# Patient Record
Sex: Female | Born: 1990 | Race: Black or African American | Hispanic: No | Marital: Single | Smoking: Current every day smoker
Health system: Southern US, Community
[De-identification: ages and names within clinical notes are randomized; demographics above are authoritative.]

## PROBLEM LIST (undated history)

## (undated) DIAGNOSIS — R030 Elevated blood-pressure reading, without diagnosis of hypertension: Secondary | ICD-10-CM

## (undated) DIAGNOSIS — R7303 Prediabetes: Secondary | ICD-10-CM

---

## 2015-03-05 ENCOUNTER — Emergency Department (HOSPITAL_BASED_OUTPATIENT_CLINIC_OR_DEPARTMENT_OTHER): Payer: BLUE CROSS/BLUE SHIELD

## 2015-03-05 ENCOUNTER — Encounter (HOSPITAL_BASED_OUTPATIENT_CLINIC_OR_DEPARTMENT_OTHER): Payer: Self-pay | Admitting: Emergency Medicine

## 2015-03-05 ENCOUNTER — Emergency Department (HOSPITAL_BASED_OUTPATIENT_CLINIC_OR_DEPARTMENT_OTHER)
Admission: EM | Admit: 2015-03-05 | Discharge: 2015-03-06 | Disposition: A | Discharge status: Home / Self Care | Payer: BLUE CROSS/BLUE SHIELD | Attending: Physician Assistant | Admitting: Physician Assistant

## 2015-03-05 DIAGNOSIS — S61216A Laceration without foreign body of right little finger without damage to nail, initial encounter: Secondary | ICD-10-CM | POA: Insufficient documentation

## 2015-03-05 DIAGNOSIS — Z23 Encounter for immunization: Secondary | ICD-10-CM | POA: Diagnosis not present

## 2015-03-05 DIAGNOSIS — Y9321 Activity, ice skating: Secondary | ICD-10-CM | POA: Insufficient documentation

## 2015-03-05 DIAGNOSIS — Y998 Other external cause status: Secondary | ICD-10-CM | POA: Insufficient documentation

## 2015-03-05 DIAGNOSIS — Y9289 Other specified places as the place of occurrence of the external cause: Secondary | ICD-10-CM | POA: Diagnosis not present

## 2015-03-05 DIAGNOSIS — F17200 Nicotine dependence, unspecified, uncomplicated: Secondary | ICD-10-CM | POA: Insufficient documentation

## 2015-03-05 DIAGNOSIS — S6991XA Unspecified injury of right wrist, hand and finger(s), initial encounter: Secondary | ICD-10-CM | POA: Diagnosis present

## 2015-03-05 DIAGNOSIS — IMO0002 Reserved for concepts with insufficient information to code with codable children: Secondary | ICD-10-CM

## 2015-03-05 MED ORDER — HYDROCODONE-ACETAMINOPHEN 5-325 MG PO TABS: 1.0000 | ORAL_TABLET | Freq: Once | ORAL | Status: DC

## 2015-03-05 MED ORDER — LIDOCAINE HCL (PF) 1 % IJ SOLN
5.0000 mL | Freq: Once | INTRAMUSCULAR | Status: AC
  Administered 2015-03-05: 5 mL
  Filled 2015-03-05: qty 5

## 2015-03-05 MED ORDER — TETANUS-DIPHTH-ACELL PERTUSSIS 5-2.5-18.5 LF-MCG/0.5 IM SUSP
0.5000 mL | Freq: Once | INTRAMUSCULAR | Status: AC
  Administered 2015-03-05: 0.5 mL via INTRAMUSCULAR
  Filled 2015-03-05: qty 0.5

## 2015-03-05 NOTE — ED Notes (Signed)
Pt in c/o laceration to R hand. Occurred while ice skating, she fell and another person stepped on her hand with their skate. Bleeding controlled.

## 2015-03-05 NOTE — ED Provider Notes (Signed)
CSN: [id number]     Arrival date & time 03/05/15  2143 History   First MD Initiated Contact with Patient 03/05/15 2147     Chief Complaint  Patient presents with  . Extremity Laceration     (Consider location/radiation/quality/duration/timing/severity/associated sxs/prior Treatment) Patient is a 25 y.o. female presenting with skin laceration. The history is provided by the patient.  Laceration Location:  Hand Hand laceration location:  R finger Length (cm):  2 Depth:  Through underlying tissue Quality: straight   Bleeding: controlled with pressure   Time since incident: just prior to arrival to the ED. Laceration mechanism:  Metal edge (blade of ice skate) Pain details:    Quality:  Sharp   Severity:  Moderate   Timing:  Constant   Progression:  Worsening Foreign body present:  No foreign bodies Relieved by:  None tried Worsened by:  Movement Tetanus status:  Out of date  Jill Cole is a 25 y.o. female who presents to the ED with a laceration to the right little finger. She states that she fell and someone skated over her finger causing pain and laceration.   History reviewed. No pertinent past medical history. History reviewed. No pertinent past surgical history. History reviewed. No pertinent family history. Social History  Substance Use Topics  . Smoking status: Current Every Day Smoker  . Smokeless tobacco: None  . Alcohol Use: Yes   OB History    No data available     Review of Systems  Musculoskeletal: Positive for joint swelling.       Right little finger pain  Skin: Positive for wound.  all other systems negative    Allergies  Review of patient's allergies indicates no known allergies.  Home Medications   Prior to Admission medications   Medication Sig Start Date End Date Taking? Authorizing Provider  cephALEXin (KEFLEX) 500 MG capsule Take 1 capsule (500 mg total) by mouth 3 (three) times daily. 03/06/15   Jill Orlene Och, NP    HYDROcodone-acetaminophen (NORCO/VICODIN) 5-325 MG tablet Take 2 tablets by mouth every 4 (four) hours as needed. 03/06/15   Jill Orlene Och, NP   BP 136/86 mmHg  Pulse 85  Temp(Src) 98 F (36.7 C) (Oral)  Resp 20  SpO2 97% Physical Exam  Constitutional: She is oriented to person, place, and time. She appears well-developed and well-nourished. No distress.  HENT:  Head: Normocephalic and atraumatic.  Eyes: Conjunctivae and EOM are normal.  Neck: Neck supple.  Cardiovascular: Normal rate.   Pulmonary/Chest: Effort normal.  Abdominal: Soft. There is no tenderness.  Musculoskeletal: Normal range of motion.       Right hand: She exhibits tenderness, bony tenderness, laceration and swelling. She exhibits normal range of motion and normal capillary refill. Normal sensation noted. Normal strength noted. She exhibits no thumb/finger opposition.       Hands: Extensor tendon visualized and laceration noted.   Neurological: She is alert and oriented to person, place, and time. No cranial nerve deficit.  Skin: Skin is warm and dry.  Psychiatric: She has a normal mood and affect. Her behavior is normal.  Nursing note and vitals reviewed.   ED Course  Procedures  LACERATION REPAIR Performed by: NEESE,Jill Authorized by: NEESE,Jill Consent: Verbal consent obtained. Risks and benefits: risks, benefits and alternatives were discussed Consent given by: patient Patient identity confirmed: provided demographic data Prepped and Draped in normal sterile fashion  Wound explored:  There is a laceration of the extensor tendon  Laceration Location: dorsum  of right little finger  Laceration Length: 4cm  No Foreign Bodies seen or palpated  Anesthesia: local infiltration  Local anesthetic: lidocaine 1% without epinephrine  Anesthetic total: 3 ml  Irrigation method: syringe  Amount of cleaning: standard  Skin closure: 4-0 prolene  Number of sutures: 10  Technique: intrrupted  Patient  tolerance: Patient tolerated the procedure well with no immediate complications.  Dg Hand Complete Right  03/05/2015  CLINICAL DATA:  Laceration to fingers. EXAM: RIGHT HAND - COMPLETE 3+ VIEW COMPARISON:  None. FINDINGS: Normal alignment no fracture. Bandage overlying the fifth finger. No foreign body. IMPRESSION: Negative for fracture or foreign body. Electronically Signed   By: Marlan Palau M.D.   On: 03/05/2015 23:09     MDM  25 y.o. female with laceration of the right little finger stable for d/c with full flexion and extension of the little finger and good strength despite laceration of the tendon. Instructed patient to schedule appointment for follow up with the hand surgeon. Will start antibiotics and pain management. Discussed d/c instructions with the patient and her mother.   Final diagnoses:  Laceration of right little finger w/o foreign body w/o damage to nail, initial encounter        Janne Napoleon, NP 03/06/15 0012  Courteney Randall An, MD 03/06/15 2254

## 2015-03-06 DIAGNOSIS — S61216A Laceration without foreign body of right little finger without damage to nail, initial encounter: Secondary | ICD-10-CM | POA: Diagnosis not present

## 2015-03-06 MED ORDER — BACITRACIN ZINC 500 UNIT/GM EX OINT
TOPICAL_OINTMENT | Freq: Two times a day (BID) | CUTANEOUS | Status: DC
  Administered 2015-03-06: 15.5556 via TOPICAL
  Filled 2015-03-06: qty 28.35

## 2015-03-06 MED ORDER — HYDROCODONE-ACETAMINOPHEN 5-325 MG PO TABS: 2.0000 | ORAL_TABLET | ORAL | Status: DC | PRN

## 2015-03-06 MED ORDER — CEPHALEXIN 250 MG PO CAPS
500.0000 mg | ORAL_CAPSULE | Freq: Once | ORAL | Status: AC
  Administered 2015-03-06: 500 mg via ORAL
  Filled 2015-03-06: qty 2

## 2015-03-06 MED ORDER — CEPHALEXIN 500 MG PO CAPS: 500.0000 mg | ORAL_CAPSULE | Freq: Three times a day (TID) | ORAL | Status: DC

## 2015-03-06 NOTE — ED Notes (Signed)
Pt understood the procedure and all questions were answered. CMS intact before and after.

## 2015-03-06 NOTE — Discharge Instructions (Signed)
You will need to call the hand surgeon tomorrow for follow up. The sutures will need to stay in for 10 days unless the hand surgeon sees your before then.

## 2015-11-25 ENCOUNTER — Encounter (HOSPITAL_COMMUNITY): Payer: Self-pay

## 2015-11-25 ENCOUNTER — Emergency Department (HOSPITAL_COMMUNITY)
Admission: EM | Admit: 2015-11-25 | Discharge: 2015-11-26 | Disposition: A | Discharge status: Home / Self Care | Payer: BLUE CROSS/BLUE SHIELD | Attending: Emergency Medicine | Admitting: Emergency Medicine

## 2015-11-25 DIAGNOSIS — A5901 Trichomonal vulvovaginitis: Secondary | ICD-10-CM | POA: Insufficient documentation

## 2015-11-25 DIAGNOSIS — F17200 Nicotine dependence, unspecified, uncomplicated: Secondary | ICD-10-CM | POA: Insufficient documentation

## 2015-11-25 DIAGNOSIS — R102 Pelvic and perineal pain: Secondary | ICD-10-CM | POA: Diagnosis not present

## 2015-11-25 DIAGNOSIS — R1030 Lower abdominal pain, unspecified: Secondary | ICD-10-CM | POA: Diagnosis present

## 2015-11-25 LAB — URINALYSIS, ROUTINE W REFLEX MICROSCOPIC
Bilirubin Urine: NEGATIVE
Glucose, UA: NEGATIVE mg/dL
Hgb urine dipstick: NEGATIVE
Ketones, ur: NEGATIVE mg/dL
Leukocytes, UA: NEGATIVE
Nitrite: NEGATIVE
Protein, ur: NEGATIVE mg/dL
Specific Gravity, Urine: 1.019 (ref 1.005–1.030)
pH: 8 (ref 5.0–8.0)

## 2015-11-25 LAB — CBC
HCT: 41.5 % (ref 36.0–46.0)
Hemoglobin: 13.2 g/dL (ref 12.0–15.0)
MCH: 27 pg (ref 26.0–34.0)
MCHC: 31.8 g/dL (ref 30.0–36.0)
MCV: 84.9 fL (ref 78.0–100.0)
Platelets: 291 K/uL (ref 150–400)
RBC: 4.89 MIL/uL (ref 3.87–5.11)
RDW: 14.2 % (ref 11.5–15.5)
WBC: 12.9 K/uL — ABNORMAL HIGH (ref 4.0–10.5)

## 2015-11-25 LAB — COMPREHENSIVE METABOLIC PANEL
ALBUMIN: 3.8 g/dL (ref 3.5–5.0)
ALK PHOS: 58 U/L (ref 38–126)
ALT: 17 U/L (ref 14–54)
ANION GAP: 8 (ref 5–15)
AST: 26 U/L (ref 15–41)
BILIRUBIN TOTAL: 0.3 mg/dL (ref 0.3–1.2)
BUN: 5 mg/dL — ABNORMAL LOW (ref 6–20)
CALCIUM: 9.8 mg/dL (ref 8.9–10.3)
CO2: 24 mmol/L (ref 22–32)
Chloride: 107 mmol/L (ref 101–111)
Creatinine, Ser: 0.79 mg/dL (ref 0.44–1.00)
GFR calc Af Amer: >60 mL/min (ref >60)
GLUCOSE: 121 mg/dL — AB (ref 65–99)
Potassium: 3.6 mmol/L (ref 3.5–5.1)
Sodium: 139 mmol/L (ref 135–145)
TOTAL PROTEIN: 8 g/dL (ref 6.5–8.1)

## 2015-11-25 LAB — I-STAT BETA HCG BLOOD, ED (MC, WL, AP ONLY)

## 2015-11-25 LAB — URINE MICROSCOPIC-ADD ON: RBC / HPF: NONE SEEN RBC/hpf (ref 0–5)

## 2015-11-25 LAB — LIPASE, BLOOD: Lipase: 26 U/L (ref 11–51)

## 2015-11-25 NOTE — ED Triage Notes (Signed)
Pt reports lower abdominal pain/pelvic pain, onset today at 6pm. Denies vaginal bleeding or discharge. Emesis x1 of phlegm today. LBM today "but it wasn't a full BM." Denies exposure to STDs. Denies trouble urinating.

## 2015-11-25 NOTE — ED Provider Notes (Signed)
[facility]-EMERGENCY DEPT Provider Note   CSN: [id number] Arrival date & time: 11/25/15  2037  By signing my name below, I, Emmanuella Mensah, attest that this documentation has been prepared under the direction and in the presence of Kerrie BuffaloHope Erasto Sleight, NP. Electronically Signed: Angelene GiovanniEmmanuella Mensah, ED Scribe. 11/25/15. 11:22 PM.   History   Chief Complaint Chief Complaint  Patient presents with  . Abdominal Pain    HPI Comments: Jill Cole is a 25 y.o. female who presents to the Emergency Department complaining of gradually worsening moderate suprapubic pain she describes as squeezing and cramping onset today. She reports associated episode of vomiting up phlegm today. No alleviating factors noted. Pt has not tried any medications PTA. She has NKDA. She reports a hx of Chlamydia last year. She states that she is currently sexually active with on partner and does not always use protection. Pt is not currently on birth control. She explains that she was treated with Macrobid earlier this week for a UTI. She denies any fever, chills, nausea, vaginal bleeding, vaginal discharge, dysuria, hematuria, or any other symptoms.   The history is provided by the patient. No language interpreter was used.    History reviewed. No pertinent past medical history.  There are no active problems to display for this patient.   History reviewed. No pertinent surgical history.  OB History    No data available       Home Medications    Prior to Admission medications   Medication Sig Start Date End Date Taking? Authorizing Provider  metroNIDAZOLE (FLAGYL) 500 MG tablet Take 1 tablet (500 mg total) by mouth 2 (two) times daily. 11/26/15   Devlyn Parish Orlene OchM Jamice Carreno, NP    Family History No family history on file.  Social History Social History  Substance Use Topics  . Smoking status: Current Every Day Smoker  . Smokeless tobacco: Not on file  . Alcohol use Yes     Allergies   Review of patient's allergies  indicates no known allergies.   Review of Systems Review of Systems  Constitutional: Negative for chills and fever.  Gastrointestinal: Positive for abdominal pain and vomiting. Negative for nausea.  Genitourinary: Negative for dysuria, hematuria, vaginal bleeding and vaginal discharge.  All other systems reviewed and are negative.    Physical Exam Updated Vital Signs BP 138/90 (BP Location: Left Arm)   Pulse 87   Temp 98.1 F (36.7 C) (Oral)   Resp 18   Ht 5\' 3"  (1.6 m) Comment: Simultaneous filing. User may not have seen previous data.  Wt 117.5 kg Comment: Simultaneous filing. User may not have seen previous data.  LMP 10/12/2015   SpO2 100%   BMI 45.88 kg/m   Physical Exam  Constitutional: She is oriented to person, place, and time. She appears well-developed and well-nourished. No distress.  HENT:  Head: Normocephalic and atraumatic.  Eyes: Conjunctivae and EOM are normal.  Neck: Neck supple. No tracheal deviation present.  Cardiovascular: Normal rate.   Pulmonary/Chest: Effort normal. No respiratory distress.  Abdominal: Soft. Bowel sounds are normal. There is no tenderness.  Unable to reproduce the cramping pain the patient had earlier.   Genitourinary:  Genitourinary Comments: External genitalia without lesions, frothy d/c vaginal vault, no CMT, no adnexal tenderness or mass palpated, uterus without palpable enlargement.   Musculoskeletal: Normal range of motion.  Neurological: She is alert and oriented to person, place, and time.  Skin: Skin is warm and dry.  Psychiatric: She has a normal mood and affect.  Her behavior is normal.  Nursing note and vitals reviewed.    ED Treatments / Results  DIAGNOSTIC STUDIES: Oxygen Saturation is 100% on RA, normal by my interpretation.    COORDINATION OF CARE: 11:18 PM- Pt advised of plan for treatment and pt agrees. Pt will receive pelvic examination for further evaluation.    Labs (all labs ordered are listed, but  only abnormal results are displayed) Labs Reviewed  WET PREP, GENITAL - Abnormal; Notable for the following:       Result Value   Trich, Wet Prep PRESENT (*)    Clue Cells Wet Prep HPF POC PRESENT (*)    WBC, Wet Prep HPF POC FEW (*)    All other components within normal limits  COMPREHENSIVE METABOLIC PANEL - Abnormal; Notable for the following:    Glucose, Bld 121 (*)    BUN 5 (*)    All other components within normal limits  CBC - Abnormal; Notable for the following:    WBC 12.9 (*)    All other components within normal limits  URINALYSIS, ROUTINE W REFLEX MICROSCOPIC (NOT AT [facility]) - Abnormal; Notable for the following:    APPearance TURBID (*)    All other components within normal limits  URINE MICROSCOPIC-ADD ON - Abnormal; Notable for the following:    Squamous Epithelial / LPF 0-5 (*)    Bacteria, UA FEW (*)    All other components within normal limits  LIPASE, BLOOD  RPR  HIV ANTIBODY (ROUTINE TESTING)  I-STAT BETA HCG BLOOD, ED ([facility], [facility], [facility] ONLY)  GC/CHLAMYDIA PROBE AMP ([place]) NOT AT [facility]    Radiology No results found.  Procedures Procedures (including critical care time)  Medications Ordered in ED Medications - No data to display   Initial Impression / Assessment and Plan / ED Course  Kerrie Buffalo, NP has reviewed the triage vital signs and the nursing notes.  Pertinent lab results that were available during my care of the patient were reviewed by me and considered in my medical decision making (see chart for details).  Clinical Course    Patient is nontoxic, nonseptic appearing, in no apparent distress.  Patient's pain and other symptoms adequately managed in emergency department.  Fluid bolus given. Labs and VS reviewed.  Patient does not meet the SIRS or Sepsis criteria.  On repeat exam patient does not have a surgical abdomin and there are no peritoneal signs.  No indication of appendicitis, bowel obstruction, bowel perforation, cholecystitis,  diverticulitis, PID or ectopic pregnancy.  Patient discharged home with  Treatment for trichomonas and need for partner treatment and given strict instructions for follow-up with [facility].  I have also discussed reasons to return immediately to the ER.  Patient expresses understanding and agrees with plan.  Final Clinical Impressions(s) / ED Diagnoses   Final diagnoses:  Pelvic pain in female  Trichomonas vaginitis    New Prescriptions New Prescriptions   METRONIDAZOLE (FLAGYL) 500 MG TABLET    Take 1 tablet (500 mg total) by mouth 2 (two) times daily.   I personally performed the services described in this documentation, which was scribed in my presence. The recorded information has been reviewed and is accurate.    [address] [place], NP 11/26/15 1610    Gilda Crease, MD 11/27/15 [phone]

## 2015-11-26 LAB — WET PREP, GENITAL
SPERM: NONE SEEN
YEAST WET PREP: NONE SEEN

## 2015-11-26 LAB — RPR: RPR Ser Ql: NONREACTIVE

## 2015-11-26 MED ORDER — METRONIDAZOLE 500 MG PO TABS: 500.0000 mg | ORAL_TABLET | Freq: Two times a day (BID) | ORAL | 0 refills | Status: AC

## 2015-11-27 LAB — HIV ANTIBODY (ROUTINE TESTING W REFLEX): HIV Screen 4th Generation wRfx: NONREACTIVE

## 2015-11-28 LAB — GC/CHLAMYDIA PROBE AMP (~~LOC~~) NOT AT ARMC
CHLAMYDIA, DNA PROBE: NEGATIVE
NEISSERIA GONORRHEA: NEGATIVE

## 2017-04-30 ENCOUNTER — Other Ambulatory Visit: Payer: Self-pay

## 2017-04-30 ENCOUNTER — Encounter (HOSPITAL_BASED_OUTPATIENT_CLINIC_OR_DEPARTMENT_OTHER): Payer: Self-pay

## 2017-04-30 ENCOUNTER — Emergency Department (HOSPITAL_BASED_OUTPATIENT_CLINIC_OR_DEPARTMENT_OTHER)
Admission: EM | Admit: 2017-04-30 | Discharge: 2017-04-30 | Disposition: A | Discharge status: Home / Self Care | Payer: BLUE CROSS/BLUE SHIELD | Attending: Emergency Medicine | Admitting: Emergency Medicine

## 2017-04-30 DIAGNOSIS — L299 Pruritus, unspecified: Secondary | ICD-10-CM | POA: Diagnosis not present

## 2017-04-30 DIAGNOSIS — F17210 Nicotine dependence, cigarettes, uncomplicated: Secondary | ICD-10-CM | POA: Insufficient documentation

## 2017-04-30 DIAGNOSIS — R21 Rash and other nonspecific skin eruption: Secondary | ICD-10-CM | POA: Insufficient documentation

## 2017-04-30 HISTORY — DX: Elevated blood-pressure reading, without diagnosis of hypertension: R03.0

## 2017-04-30 HISTORY — DX: Prediabetes: R73.03

## 2017-04-30 MED ORDER — PERMETHRIN 5 % EX CREA: TOPICAL_CREAM | CUTANEOUS | 0 refills | Status: AC

## 2017-04-30 MED ORDER — HYDROXYZINE HCL 25 MG PO TABS: 25.0000 mg | ORAL_TABLET | Freq: Four times a day (QID) | ORAL | 0 refills | Status: AC | PRN

## 2017-04-30 NOTE — ED Triage Notes (Signed)
C/o scattered rash x 2 weeks-NAD-steady gait 

## 2017-04-30 NOTE — ED Provider Notes (Signed)
[facility] EMERGENCY DEPARTMENT Provider Note   CSN: [id number] Arrival date & time: 04/30/17  2200     History   Chief Complaint Chief Complaint  Patient presents with  . Rash    HPI Jill Cole is a 27 y.o. female.  HPI   2 weeks of rash Present over arms, breasts, legs, one on hand in web space, crease of arm, and groin. Itching Hx of hives over the summer but this is different Friend has eczema over legs, no known contacts No new medications, no new exposures, no pets, detergents, foods Feels like popping up in spots and lines on arms Tried hydroxizine which helped temporarily Tried aveeno antiitch No dyspnea, nausea, vomiting or fevers   Past Medical History:  Diagnosis Date  . Prediabetes   . Prehypertension     There are no active problems to display for this patient.   History reviewed. No pertinent surgical history.   OB History   None      Home Medications    Prior to Admission medications   Medication Sig Start Date End Date Taking? Authorizing Provider  hydrOXYzine (ATARAX/VISTARIL) 25 MG tablet Take 1-2 tablets (25-50 mg total) by mouth every 6 (six) hours as needed for itching. 04/30/17   Alvira MondaySchlossman, Christropher Gintz, MD  metroNIDAZOLE (FLAGYL) 500 MG tablet Take 1 tablet (500 mg total) by mouth 2 (two) times daily. 11/26/15   Janne NapoleonNeese, Hope M, NP  permethrin (ELIMITE) 5 % cream Apply to affected area once from the neck down, leave on for 8-14 hours, then wash off. Repeat in 1 week. 04/30/17   Alvira MondaySchlossman, Wells Gerdeman, MD    Family History No family history on file.  Social History Social History   Tobacco Use  . Smoking status: Current Every Day Smoker    Types: Cigarettes  . Smokeless tobacco: Never Used  Substance Use Topics  . Alcohol use: Yes    Comment: occ  . Drug use: Never     Allergies   Patient has no known allergies.   Review of Systems Review of Systems  Constitutional: Negative for fever.  HENT: Negative for sore  throat.   Eyes: Negative for visual disturbance.  Respiratory: Negative for cough and shortness of breath.   Cardiovascular: Negative for chest pain.  Gastrointestinal: Negative for abdominal pain.  Genitourinary: Negative for difficulty urinating.  Musculoskeletal: Negative for back pain and neck pain.  Skin: Positive for rash.  Neurological: Negative for syncope and headaches.     Physical Exam Updated Vital Signs BP (!) 148/103 (BP Location: Right Arm)   Pulse 95   Temp 98.2 F (36.8 C) (Oral)   Resp 20   Ht 5\' 3"  (1.6 m)   Wt 127.7 kg (281 lb 9.6 oz)   LMP 04/12/2017   SpO2 99%   BMI 49.88 kg/m   Physical Exam  Constitutional: She is oriented to person, place, and time. She appears well-developed and well-nourished. No distress.  HENT:  Head: Normocephalic and atraumatic.  Eyes: Conjunctivae and EOM are normal.  Neck: Normal range of motion.  Cardiovascular: Normal rate, regular rhythm and intact distal pulses.  Pulmonary/Chest: Effort normal. No respiratory distress. She has no wheezes.  Musculoskeletal: She exhibits no edema or tenderness.  Neurological: She is alert and oriented to person, place, and time.  Skin: Skin is warm and dry. Rash (      tiny papules scattered, some over webspace right hand, over arm, leg, chest, back) noted. She is not diaphoretic. No  erythema.  Nursing note and vitals reviewed.    ED Treatments / Results  Labs (all labs ordered are listed, but only abnormal results are displayed) Labs Reviewed - No data to display  EKG None  Radiology No results found.  Procedures Procedures (including critical care time)  Medications Ordered in ED Medications - No data to display   Initial Impression / Assessment and Plan / ED Course  I have reviewed the triage vital signs and the nursing notes.  Pertinent labs & imaging results that were available during my care of the patient were reviewed by me and considered in my medical  decision making (see chart for details).     27 year old female with no significant medical history presents with concern of rash for 14 days. Rash does not have the appearance of SSS, TEN, erythroderma, RMSF or hives.   Pruritic rash allergic versus possible allergic to bites.  Some lesions and location concerning for scabies. Discussed empiric scabies treatment and care, provided rx for permethrin. Given rx for hydroxyzine. Patient discharged in stable condition with understanding of reasons to return.   Final Clinical Impressions(s) / ED Diagnoses   Final diagnoses:  Rash    ED Discharge Orders        Ordered    permethrin (ELIMITE) 5 % cream     04/30/17 2314    hydrOXYzine (ATARAX/VISTARIL) 25 MG tablet  Every 6 hours PRN     04/30/17 2314       Alvira Monday, MD 05/01/17 0340

## 2017-05-05 IMAGING — CR DG HAND COMPLETE 3+V*R*
3 series · 3 of 3 positions shown · non-contrast
Comparison: None.

CLINICAL DATA: Laceration to fingers.

EXAM:
RIGHT HAND - COMPLETE 3+ VIEW

[x hand pa right]
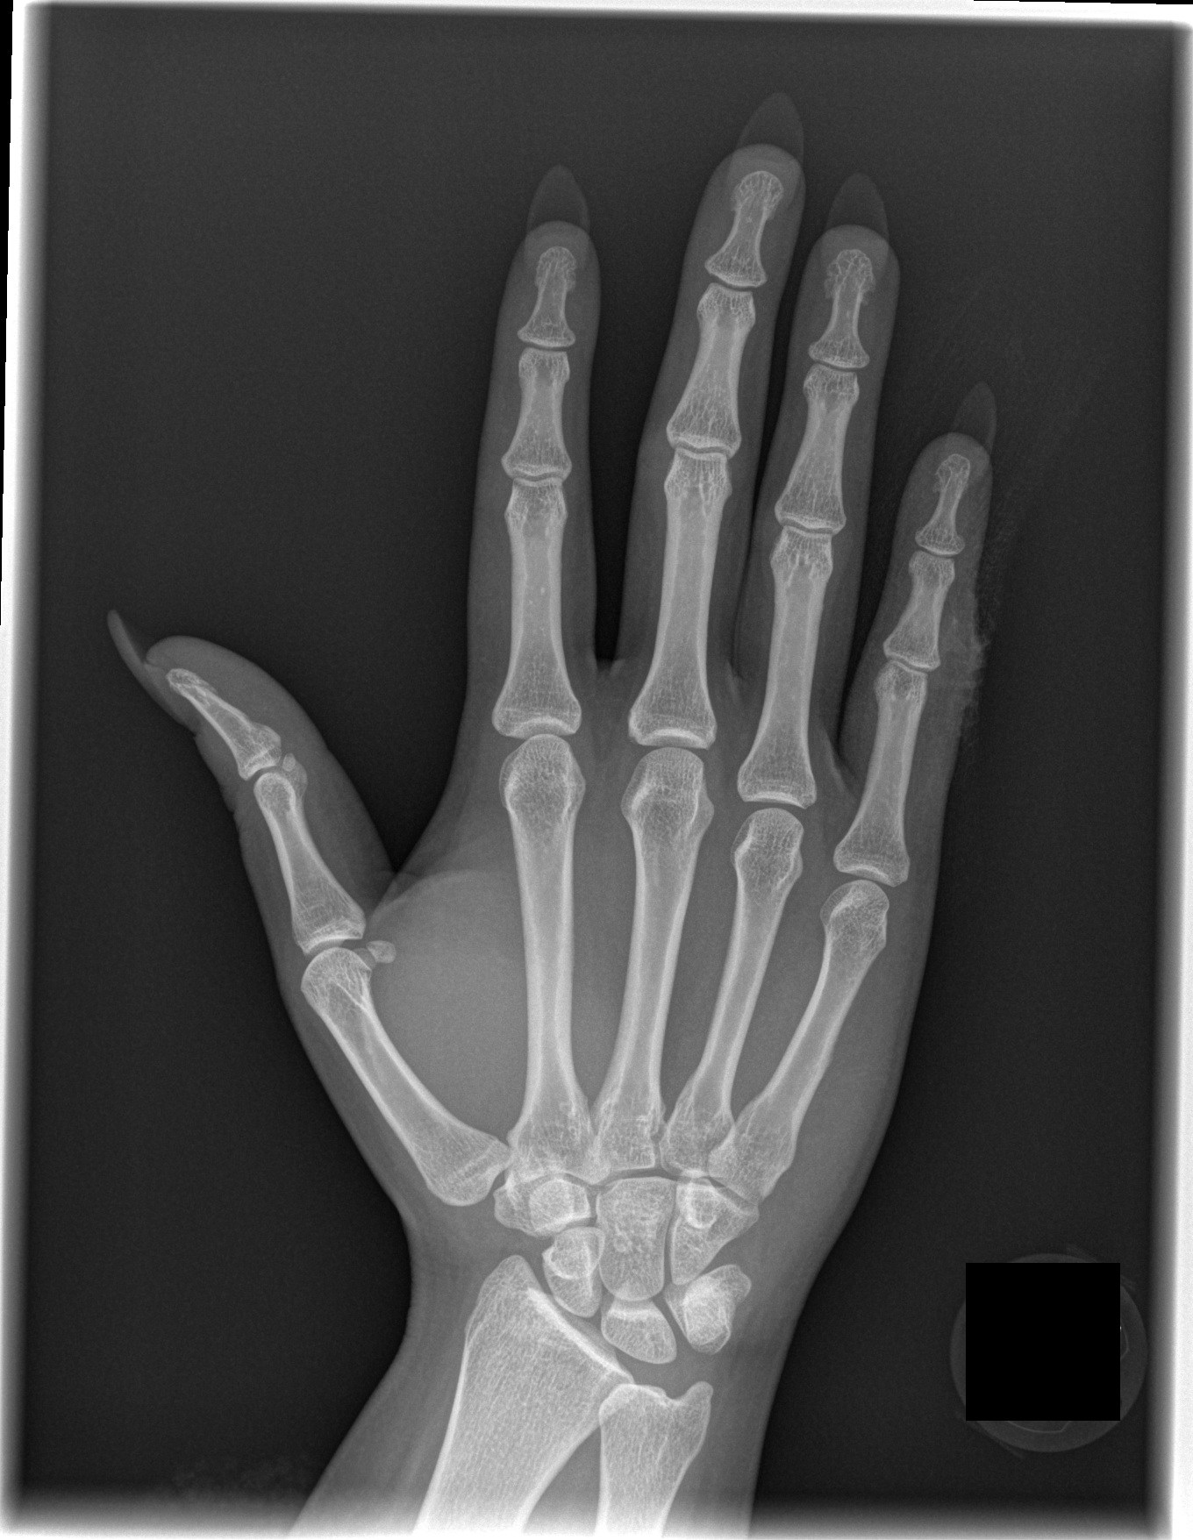

[x hand oblique right]
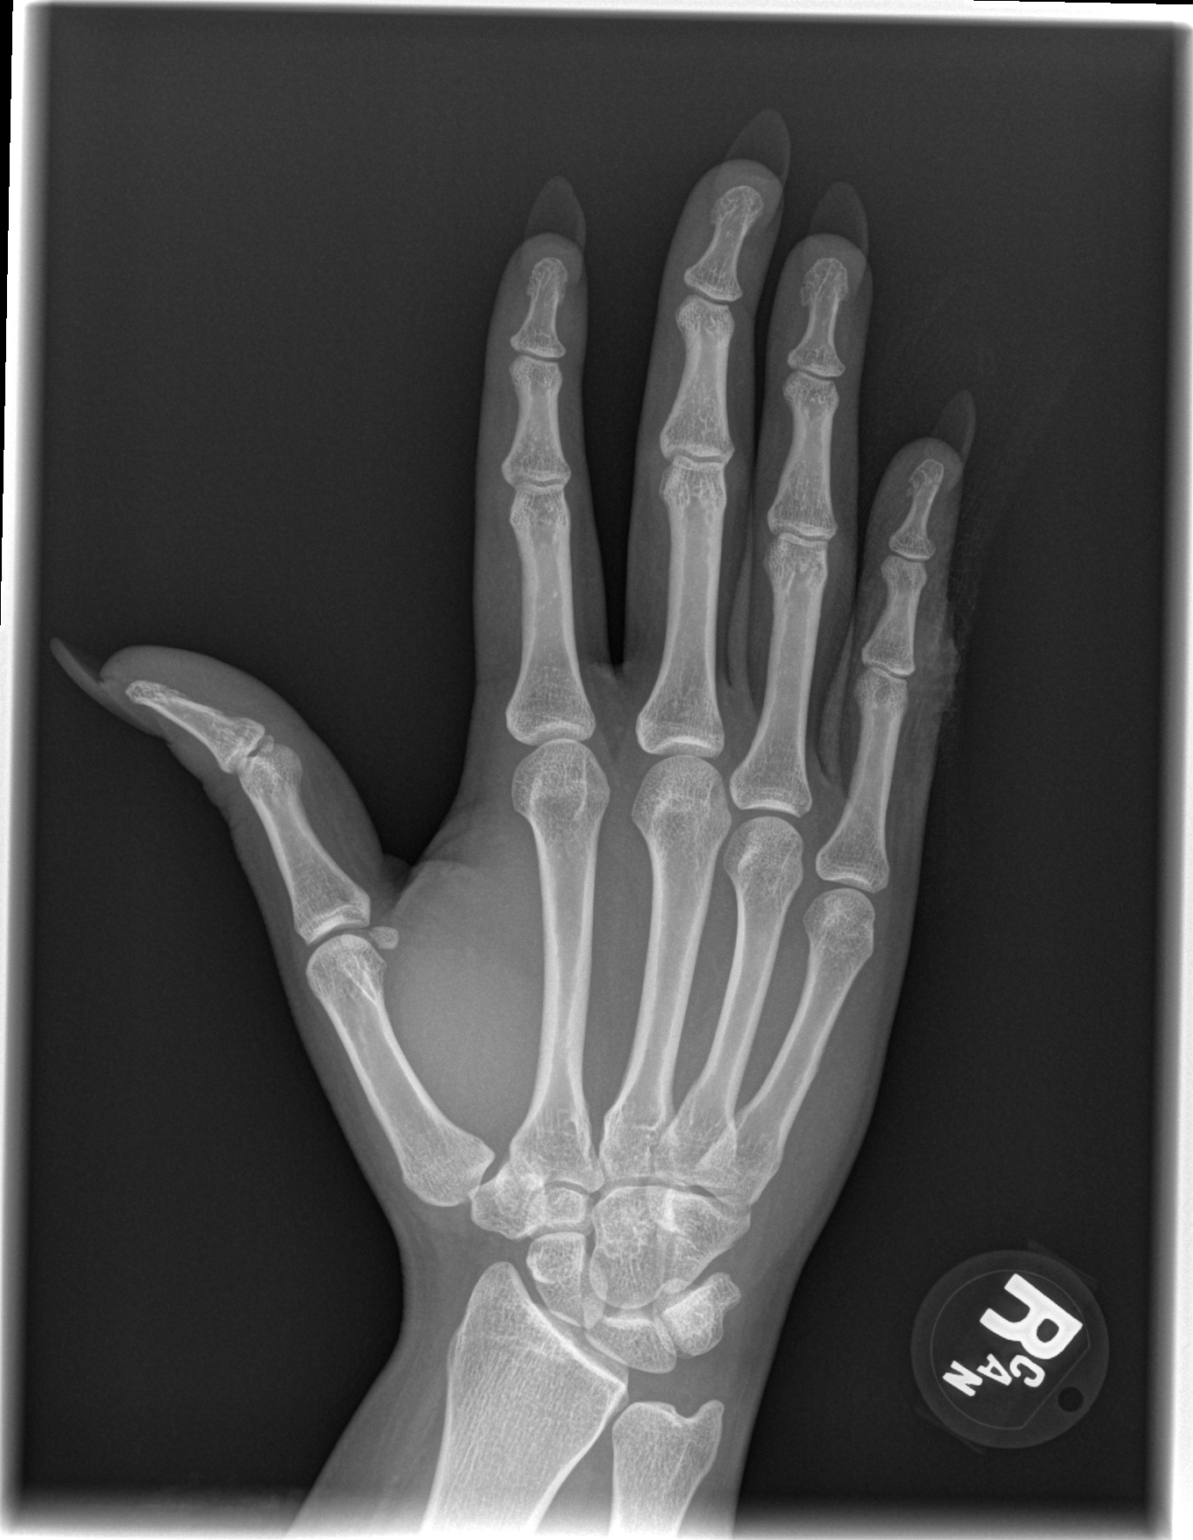

[x hand lat right]
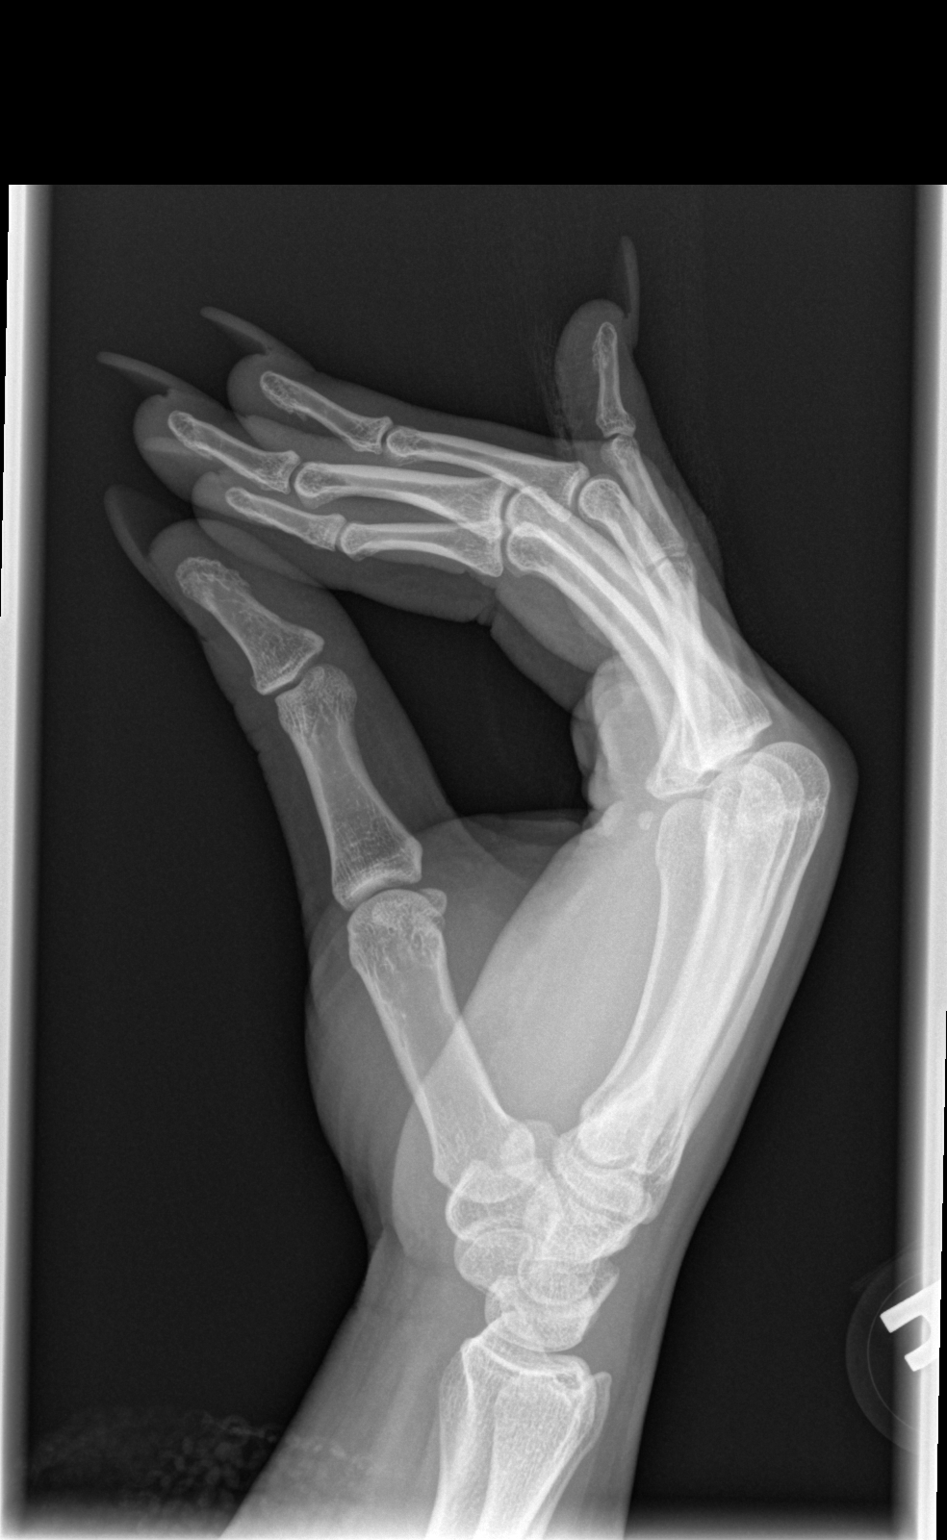

[3 of 3 positions shown; findings below may reference images not displayed]

FINDINGS: Normal alignment no fracture. Bandage overlying the fifth finger. No
foreign body.
IMPRESSION: Negative for fracture or foreign body.

## 2020-01-28 ENCOUNTER — Other Ambulatory Visit: Payer: Self-pay

## 2020-01-28 ENCOUNTER — Emergency Department (HOSPITAL_COMMUNITY): Admission: EM | Admit: 2020-01-28 | Discharge: 2020-01-28 | Discharge status: Absent without leave | Payer: Self-pay
